# Patient Record
Sex: Female | Born: 1985 | Race: Black or African American | Hispanic: No | Marital: Single | Smoking: Never smoker
Health system: Southern US, Community
[De-identification: ages and names within clinical notes are randomized; demographics above are authoritative.]

## PROBLEM LIST (undated history)

## (undated) DIAGNOSIS — D649 Anemia, unspecified: Secondary | ICD-10-CM

---
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
## 2010-01-10 ENCOUNTER — Emergency Department (HOSPITAL_COMMUNITY): Admission: EM | Admit: 2010-01-10 | Discharge: 2010-01-10 | Payer: Self-pay | Admitting: Emergency Medicine

## 2010-03-20 ENCOUNTER — Ambulatory Visit: Payer: Self-pay | Admitting: Oncology

## 2010-04-10 LAB — CMP (CANCER CENTER ONLY)
Alkaline Phosphatase: 59 U/L (ref 26–84)
Calcium: 9.3 mg/dL (ref 8.0–10.3)
Chloride: 102 mEq/L (ref 98–108)
Sodium: 135 mEq/L (ref 128–145)

## 2010-04-10 LAB — CBC WITH DIFFERENTIAL (CANCER CENTER ONLY)
BASO#: 0 10*3/uL (ref 0.0–0.2)
Eosinophils Absolute: 0.1 10*3/uL (ref 0.0–0.5)
HCT: 36.4 % (ref 34.8–46.6)
LYMPH#: 1.5 10*3/uL (ref 0.9–3.3)
LYMPH%: 40.8 % (ref 14.0–48.0)
MCHC: 32.3 g/dL (ref 32.0–36.0)
MONO#: 0.2 10*3/uL (ref 0.1–0.9)
MONO%: 6.3 % (ref 0.0–13.0)
NEUT%: 51.1 % (ref 39.6–80.0)
Platelets: 153 10*3/uL (ref 145–400)
RDW: 16 % — ABNORMAL HIGH (ref 10.5–14.6)

## 2010-04-10 LAB — MORPHOLOGY - CHCC SATELLITE: PLT EST ~~LOC~~: ADEQUATE

## 2010-04-15 LAB — DIRECT ANTIGLOBULIN TEST (NOT AT ARMC): DAT (Complement): NEGATIVE

## 2010-04-15 LAB — IRON AND TIBC: %SAT: 11 % — ABNORMAL LOW (ref 20–55)

## 2010-04-15 LAB — HEMOGLOBINOPATHY EVALUATION
Hemoglobin Other: 0 % (ref 0.0–0.0)
Hgb A2 Quant: 5.5 % — ABNORMAL HIGH (ref 2.2–3.2)
Hgb A: 92.1 % — ABNORMAL LOW (ref 96.8–97.8)

## 2010-04-15 LAB — GLUCOSE 6 PHOSPHATE DEHYDROGENASE: G-6-PD, Quant: 9 U/g{Hb} (ref 4.6–13.5)

## 2010-04-15 LAB — VITAMIN B12: Vitamin B-12: 488 pg/mL (ref 211–911)

## 2010-04-15 LAB — FERRITIN: Ferritin: 6 ng/mL — ABNORMAL LOW (ref 10–291)

## 2010-04-15 LAB — RETICULOCYTES (CHCC): Retic Ct Pct: 0.5 % (ref 0.4–3.1)

## 2010-04-29 ENCOUNTER — Ambulatory Visit: Payer: Self-pay | Admitting: Oncology

## 2010-05-09 LAB — CBC WITH DIFFERENTIAL (CANCER CENTER ONLY)
BASO#: 0 10*3/uL (ref 0.0–0.2)
Eosinophils Absolute: 0.1 10*3/uL (ref 0.0–0.5)
HGB: 9.6 g/dL — ABNORMAL LOW (ref 11.6–15.9)
LYMPH%: 48.6 % — ABNORMAL HIGH (ref 14.0–48.0)
MCH: 23.2 pg — ABNORMAL LOW (ref 26.0–34.0)
MCHC: 32.5 g/dL (ref 32.0–36.0)
NEUT#: 1.3 10*3/uL — ABNORMAL LOW (ref 1.5–6.5)
Platelets: 142 10*3/uL — ABNORMAL LOW (ref 145–400)
RBC: 4.16 10*6/uL (ref 3.70–5.32)
WBC: 3.2 10*3/uL — ABNORMAL LOW (ref 3.9–10.0)

## 2010-05-28 ENCOUNTER — Ambulatory Visit: Payer: Self-pay | Admitting: Oncology

## 2010-06-27 ENCOUNTER — Ambulatory Visit: Payer: Self-pay | Admitting: Oncology

## 2010-06-28 LAB — IRON AND TIBC
Iron: 116 ug/dL (ref 42–145)
UIBC: 165 ug/dL

## 2010-06-28 LAB — CBC WITH DIFFERENTIAL (CANCER CENTER ONLY)
BASO#: 0 10*3/uL (ref 0.0–0.2)
BASO%: 0.3 % (ref 0.0–2.0)
EOS%: 1.8 % (ref 0.0–7.0)
HCT: 34.1 % — ABNORMAL LOW (ref 34.8–46.6)
LYMPH%: 55.3 % — ABNORMAL HIGH (ref 14.0–48.0)
MCHC: 33.2 g/dL (ref 32.0–36.0)
Platelets: 148 10*3/uL (ref 145–400)
WBC: 3 10*3/uL — ABNORMAL LOW (ref 3.9–10.0)

## 2010-07-31 ENCOUNTER — Encounter: Admission: RE | Admit: 2010-07-31 | Discharge: 2010-07-31 | Payer: Self-pay | Admitting: Family Medicine

## 2010-09-27 ENCOUNTER — Ambulatory Visit: Payer: Self-pay | Admitting: Oncology

## 2010-10-02 LAB — CBC WITH DIFFERENTIAL/PLATELET
EOS%: 2.1 % (ref 0.0–7.0)
LYMPH%: 50 % — ABNORMAL HIGH (ref 14.0–49.7)
MCV: 78.3 fL — ABNORMAL LOW (ref 79.5–101.0)
NEUT#: 1.5 10*3/uL (ref 1.5–6.5)
NEUT%: 40.2 % (ref 38.4–76.8)
Platelets: 161 10*3/uL (ref 145–400)
RDW: 13.2 % (ref 11.2–14.5)
WBC: 3.6 10*3/uL — ABNORMAL LOW (ref 3.9–10.3)

## 2010-10-02 LAB — IRON AND TIBC: UIBC: 198 ug/dL

## 2010-10-02 LAB — FERRITIN: Ferritin: 112 ng/mL (ref 10–291)

## 2010-12-03 ENCOUNTER — Ambulatory Visit
Admission: RE | Admit: 2010-12-03 | Discharge: 2010-12-03 | Payer: Self-pay | Source: Home / Self Care | Attending: Gynecology | Admitting: Gynecology

## 2011-06-27 ENCOUNTER — Encounter: Payer: Self-pay | Attending: Physical Medicine & Rehabilitation | Admitting: Physical Medicine & Rehabilitation

## 2011-09-10 ENCOUNTER — Other Ambulatory Visit: Payer: Self-pay | Admitting: Oncology

## 2011-09-10 DIAGNOSIS — D509 Iron deficiency anemia, unspecified: Secondary | ICD-10-CM

## 2011-09-19 ENCOUNTER — Ambulatory Visit: Payer: Self-pay | Admitting: Oncology

## 2011-09-19 ENCOUNTER — Other Ambulatory Visit: Payer: Self-pay | Admitting: Lab

## 2013-05-19 ENCOUNTER — Emergency Department (HOSPITAL_COMMUNITY)
Admission: EM | Admit: 2013-05-19 | Discharge: 2013-05-19 | Disposition: A | Discharge status: Home / Self Care | Payer: Worker's Compensation | Attending: Emergency Medicine | Admitting: Emergency Medicine

## 2013-05-19 ENCOUNTER — Encounter (HOSPITAL_COMMUNITY): Payer: Self-pay

## 2013-05-19 DIAGNOSIS — Y929 Unspecified place or not applicable: Secondary | ICD-10-CM | POA: Insufficient documentation

## 2013-05-19 DIAGNOSIS — Y939 Activity, unspecified: Secondary | ICD-10-CM | POA: Insufficient documentation

## 2013-05-19 DIAGNOSIS — S0990XA Unspecified injury of head, initial encounter: Secondary | ICD-10-CM | POA: Insufficient documentation

## 2013-05-19 DIAGNOSIS — W2209XA Striking against other stationary object, initial encounter: Secondary | ICD-10-CM | POA: Insufficient documentation

## 2013-05-19 MED ORDER — IBUPROFEN 400 MG PO TABS: 400.0000 mg | ORAL_TABLET | Freq: Four times a day (QID) | ORAL | Status: DC | PRN

## 2013-05-19 NOTE — ED Provider Notes (Signed)
   History    This chart was scribed for Kaylee Simmons, non-physician practitioner working with Juliet Rude. Rubin Payor, MD by Leone Payor, ED Scribe. This patient was seen in room TR08C/TR08C and the patient's care was started at 1805.  CSN: [id number] Arrival date & time 05/19/13  1805  First MD Initiated Contact with Patient 05/19/13 1812     Chief Complaint  Patient presents with  . Headache    The history is provided by the patient. No language interpreter was used.    HPI Comments: Kaylee Simmons is a 27 y.o. female who presents to the Emergency Department complaining of constant, unchanged scalp tenderness that started yesterday after hitting her head on the cabinet door. Pt took some tylenol with some moderate relief but she still has pain to the site of impact. She denies taking blood thinning medication.  She denies LOC, neck pain, slurred speech.    History reviewed. No pertinent past medical history. History reviewed. No pertinent past surgical history. History reviewed. No pertinent family history. History  Substance Use Topics  . Smoking status: Never Smoker   . Smokeless tobacco: Not on file  . Alcohol Use: No   OB History   Grav Para Term Preterm Abortions TAB SAB Ect Mult Living                 Review of Systems  Neurological: Positive for headaches. Negative for syncope and facial asymmetry.  All other systems reviewed and are negative.    Allergies  Review of patient's allergies indicates no known allergies.  Home Medications  No current outpatient prescriptions on file. BP 127/79  Pulse 97  Temp(Src) 98.4 F (36.9 C) (Oral)  Resp 16  SpO2 100%  LMP 04/25/2013 Physical Exam  Nursing note and vitals reviewed. Constitutional: She is oriented to person, place, and time. She appears well-developed and well-nourished.  HENT:  Head: Normocephalic and atraumatic.  Mild redness to vertex to scalp. Tenderness to palpation. No swelling or laceration. No abnormal  bruising.   Eyes: Conjunctivae and EOM are normal. Pupils are equal, round, and reactive to light.  Neck: Normal range of motion. Neck supple.  Cardiovascular: Normal rate, regular rhythm and normal heart sounds.   Pulmonary/Chest: Effort normal and breath sounds normal.  Abdominal: Soft. Bowel sounds are normal.  Musculoskeletal: Normal range of motion.  Neurological: She is alert and oriented to person, place, and time.  Skin: Skin is warm and dry.  Psychiatric: She has a normal mood and affect.    ED Course  Procedures (including critical care time)  DIAGNOSTIC STUDIES: Oxygen Saturation is 100% on RA, normal by my interpretation.    COORDINATION OF CARE: 6:19 PM Discussed treatment plan with pt at bedside and pt agreed to plan.   Labs Reviewed - No data to display No results found. 1. Scalp tenderness   2. Head injury without skull fracture, initial encounter     MDM  BP 127/79  Pulse 97  Temp(Src) 98.4 F (36.9 C) (Oral)  Resp 16  SpO2 100%  LMP 04/25/2013   I personally performed the services described in this documentation, which was scribed in my presence. The recorded information has been reviewed and is accurate.    Kaylee Helper, PA-C 05/19/13 1828

## 2013-05-19 NOTE — ED Notes (Signed)
Pt c/o headache x1 day after she bent over yesterday to pick something up and hit her head on the cabinet door.

## 2013-05-21 NOTE — ED Provider Notes (Signed)
Medical screening examination/treatment/procedure(s) were performed by non-physician practitioner and as supervising physician I was immediately available for consultation/collaboration.  Betzayda Braxton R. Alvera Tourigny, MD 05/21/13 1358 

## 2014-07-01 ENCOUNTER — Encounter (HOSPITAL_COMMUNITY): Payer: Self-pay | Admitting: Emergency Medicine

## 2014-07-01 ENCOUNTER — Emergency Department (HOSPITAL_COMMUNITY)
Admission: EM | Admit: 2014-07-01 | Discharge: 2014-07-01 | Disposition: A | Discharge status: Home / Self Care | Payer: No Typology Code available for payment source | Source: Home / Self Care | Attending: Family Medicine | Admitting: Family Medicine

## 2014-07-01 DIAGNOSIS — S239XXA Sprain of unspecified parts of thorax, initial encounter: Secondary | ICD-10-CM

## 2014-07-01 MED ORDER — METAXALONE 800 MG PO TABS: 800.0000 mg | ORAL_TABLET | Freq: Three times a day (TID) | ORAL | Status: DC

## 2014-07-01 NOTE — ED Provider Notes (Signed)
CSN: [id number]     Arrival date & time 07/01/14  0907 History   [id number] MD Initiated Contact with Patient 07/01/14 [phone]     Chief Complaint  Patient presents with  . Back Pain   (Consider location/radiation/quality/duration/timing/severity/associated sxs/prior Treatment) Patient is a 28 y.o. female presenting with back pain. The history is provided by the patient.  Back Pain Location:  Thoracic spine Quality:  Shooting and stiffness Stiffness is present:  In the morning Radiates to:  Does not radiate Pain severity:  Mild Onset quality:  Sudden Duration:  1 day Chronicity:  New Context comment:  Getting out of bed. Relieved by:  None tried Worsened by:  Nothing tried   History reviewed. No pertinent past medical history. History reviewed. No pertinent past surgical history. No family history on file. History  Substance Use Topics  . Smoking status: Never Smoker   . Smokeless tobacco: Not on file  . Alcohol Use: No   OB History   Grav Para Term Preterm Abortions TAB SAB Ect Mult Living                 Review of Systems  Constitutional: Negative.   Cardiovascular: Negative.   Musculoskeletal: Positive for back pain. Negative for gait problem and joint swelling.  Skin: Negative.     Allergies  Review of patient's allergies indicates no known allergies.  Home Medications   Prior to Admission medications   Medication Sig Start Date End Date Taking? Authorizing Provider  acetaminophen (TYLENOL) 325 MG tablet Take 650 mg by mouth every 6 (six) hours as needed for pain.    Historical Provider, MD  doxycycline (DORYX) 100 MG DR capsule Take 100 mg by mouth 2 (two) times daily.    Historical Provider, MD  ibuprofen (ADVIL,MOTRIN) 400 MG tablet Take 1 tablet (400 mg total) by mouth every 6 (six) hours as needed for pain. 05/19/13   Fayrene HelperBowie Tran, PA-C  metaxalone (SKELAXIN) 800 MG tablet Take 1 tablet (800 mg total) by mouth 3 (three) times daily. Prn muscle relaxer 07/01/14    Linna HoffJames D Wiliam Cauthorn, MD   BP 116/73  Pulse 74  Temp(Src) 98.7 F (37.1 C) (Oral)  Resp 16  SpO2 100%  LMP 06/10/2014 Physical Exam  Nursing note and vitals reviewed. Constitutional: She is oriented to person, place, and time. She appears well-developed and well-nourished.  Neck: Normal range of motion. Neck supple.  Pulmonary/Chest: Effort normal and breath sounds normal.  Musculoskeletal:       Back:  Neurological: She is alert and oriented to person, place, and time.  Skin: Skin is warm.    ED Course  Procedures (including critical care time) Labs Review Labs Reviewed - No data to display  Imaging Review No results found.   MDM   1. Thoracic back sprain, initial encounter        Linna HoffJames D Eliyana Pagliaro, MD 07/01/14 [phone]

## 2014-07-01 NOTE — ED Notes (Signed)
Mid-back pain, no known injury.  Area hurts with coughing, sneezing, pain increased throughout the day

## 2017-02-20 ENCOUNTER — Other Ambulatory Visit (HOSPITAL_COMMUNITY)
Admission: RE | Admit: 2017-02-20 | Discharge: 2017-02-20 | Disposition: A | Discharge status: Home / Self Care | Payer: BLUE CROSS/BLUE SHIELD | Source: Ambulatory Visit | Attending: Obstetrics & Gynecology | Admitting: Obstetrics & Gynecology

## 2017-02-20 ENCOUNTER — Other Ambulatory Visit: Payer: Self-pay | Admitting: Obstetrics & Gynecology

## 2017-02-20 DIAGNOSIS — Z01419 Encounter for gynecological examination (general) (routine) without abnormal findings: Secondary | ICD-10-CM | POA: Insufficient documentation

## 2017-02-20 DIAGNOSIS — Z1151 Encounter for screening for human papillomavirus (HPV): Secondary | ICD-10-CM | POA: Diagnosis present

## 2017-02-24 LAB — CYTOLOGY - PAP
DIAGNOSIS: NEGATIVE
HPV (WINDOPATH): NOT DETECTED

## 2017-07-02 ENCOUNTER — Encounter (HOSPITAL_BASED_OUTPATIENT_CLINIC_OR_DEPARTMENT_OTHER): Payer: Self-pay | Admitting: *Deleted

## 2017-07-02 ENCOUNTER — Emergency Department (HOSPITAL_BASED_OUTPATIENT_CLINIC_OR_DEPARTMENT_OTHER)
Admission: EM | Admit: 2017-07-02 | Discharge: 2017-07-02 | Disposition: A | Discharge status: Home / Self Care | Payer: No Typology Code available for payment source | Attending: Emergency Medicine | Admitting: Emergency Medicine

## 2017-07-02 ENCOUNTER — Emergency Department (HOSPITAL_BASED_OUTPATIENT_CLINIC_OR_DEPARTMENT_OTHER): Payer: No Typology Code available for payment source

## 2017-07-02 DIAGNOSIS — Y939 Activity, unspecified: Secondary | ICD-10-CM | POA: Diagnosis not present

## 2017-07-02 DIAGNOSIS — Y999 Unspecified external cause status: Secondary | ICD-10-CM | POA: Insufficient documentation

## 2017-07-02 DIAGNOSIS — Y929 Unspecified place or not applicable: Secondary | ICD-10-CM | POA: Diagnosis not present

## 2017-07-02 DIAGNOSIS — M546 Pain in thoracic spine: Secondary | ICD-10-CM | POA: Insufficient documentation

## 2017-07-02 DIAGNOSIS — M549 Dorsalgia, unspecified: Secondary | ICD-10-CM | POA: Diagnosis present

## 2017-07-02 DIAGNOSIS — Z79899 Other long term (current) drug therapy: Secondary | ICD-10-CM | POA: Diagnosis not present

## 2017-07-02 HISTORY — DX: Anemia, unspecified: D64.9

## 2017-07-02 MED ORDER — NAPROXEN 250 MG PO TABS: 375.0000 mg | ORAL_TABLET | Freq: Once | ORAL | Status: DC

## 2017-07-02 MED ORDER — KETOROLAC TROMETHAMINE 30 MG/ML IJ SOLN
30.0000 mg | Freq: Once | INTRAMUSCULAR | Status: AC
  Administered 2017-07-02: 30 mg via INTRAMUSCULAR
  Filled 2017-07-02: qty 1

## 2017-07-02 MED ORDER — NAPROXEN 375 MG PO TABS: 375.0000 mg | ORAL_TABLET | Freq: Two times a day (BID) | ORAL | 0 refills | Status: DC

## 2017-07-02 NOTE — ED Notes (Signed)
Pt was in bathroom and has returned to room.

## 2017-07-02 NOTE — ED Notes (Signed)
Patient left without her discharge instruction and prescription for Naproxene.  Discharge instruction and prescription given to the registration office.  MD given patient verbal instruction and she verbalized understanding when I asked her.
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
## 2018-01-16 ENCOUNTER — Encounter (HOSPITAL_BASED_OUTPATIENT_CLINIC_OR_DEPARTMENT_OTHER): Payer: Self-pay | Admitting: Adult Health

## 2018-01-16 ENCOUNTER — Other Ambulatory Visit: Payer: Self-pay

## 2018-01-16 ENCOUNTER — Emergency Department (HOSPITAL_BASED_OUTPATIENT_CLINIC_OR_DEPARTMENT_OTHER)
Admission: EM | Admit: 2018-01-16 | Discharge: 2018-01-17 | Disposition: A | Discharge status: Home / Self Care | Payer: BLUE CROSS/BLUE SHIELD | Attending: Emergency Medicine | Admitting: Emergency Medicine

## 2018-01-16 DIAGNOSIS — R102 Pelvic and perineal pain: Secondary | ICD-10-CM | POA: Insufficient documentation

## 2018-01-16 DIAGNOSIS — N939 Abnormal uterine and vaginal bleeding, unspecified: Secondary | ICD-10-CM | POA: Diagnosis present

## 2018-01-16 DIAGNOSIS — N938 Other specified abnormal uterine and vaginal bleeding: Secondary | ICD-10-CM | POA: Diagnosis not present

## 2018-01-16 DIAGNOSIS — Z79899 Other long term (current) drug therapy: Secondary | ICD-10-CM | POA: Insufficient documentation

## 2018-01-16 LAB — URINALYSIS, ROUTINE W REFLEX MICROSCOPIC
Bilirubin Urine: NEGATIVE
GLUCOSE, UA: NEGATIVE mg/dL
KETONES UR: 15 mg/dL — AB
LEUKOCYTES UA: NEGATIVE
Nitrite: NEGATIVE
PROTEIN: NEGATIVE mg/dL
Specific Gravity, Urine: >1.03 — ABNORMAL HIGH (ref 1.005–1.030)
pH: 5.5 (ref 5.0–8.0)

## 2018-01-16 LAB — PREGNANCY, URINE: PREG TEST UR: NEGATIVE

## 2018-01-16 LAB — WET PREP, GENITAL
Clue Cells Wet Prep HPF POC: NONE SEEN
Sperm: NONE SEEN
TRICH WET PREP: NONE SEEN
Yeast Wet Prep HPF POC: NONE SEEN

## 2018-01-16 LAB — URINALYSIS, MICROSCOPIC (REFLEX)

## 2018-01-16 NOTE — ED Provider Notes (Signed)
[facility] Provider Note: Lowella DellJ. Lane Devere Brem, MD, FACEP  CSN: [id number] MRN: [id number] ARRIVAL: 01/16/18 at 2131 ROOM: MH10/MH10   CHIEF COMPLAINT  Vaginal Bleeding   HISTORY OF PRESENT ILLNESS  01/16/18 11:00 PM Kaylee Simmons is a 32 y.o. female who has been bleeding vaginally for the past 11 days.  This is earlier than her expected time of menses.  She was on birth control pills but stopped taking them when this bleeding began.  Bleeding has been heavier than a period at times but has lightened in the past 2 days.  She has had clots at times.  She has had pelvic pain along with the bleeding which she describes as somewhat like cramps and somewhat like a pulling sensation.  Pain is worse with movement or palpation.  It is more prominent on the right than the left.  She saw her OB/GYN yesterday and was scheduled for an ultrasound on the 18th of this month but is here because she does not want to wait for the ultrasound and would like to know what is going on tonight.   Past Medical History:  Diagnosis Date  . Anemia     History reviewed. No pertinent surgical history.  History reviewed. No pertinent family history.  Social History   Tobacco Use  . Smoking status: Never Smoker  . Smokeless tobacco: Never Used  Substance Use Topics  . Alcohol use: No  . Drug use: No    Prior to Admission medications   Medication Sig Start Date End Date Taking? Authorizing Provider  Ascorbic Acid (VITAMIN C) 100 MG tablet Take 100 mg by mouth daily.   Yes [provider]  IRON PO Take by mouth.   Yes [provider]  norgestimate-ethinyl estradiol (ORTHO-CYCLEN,SPRINTEC,PREVIFEM) 0.25-35 MG-MCG tablet Take 1 tablet by mouth daily.   Yes [provider]  acetaminophen (TYLENOL) 325 MG tablet Take 650 mg by mouth every 6 (six) hours as needed for pain.    [provider]  doxycycline (DORYX) 100 MG DR capsule Take 100 mg by mouth 2 (two) times daily.     [provider]  ibuprofen (ADVIL,MOTRIN) 400 MG tablet Take 1 tablet (400 mg total) by mouth every 6 (six) hours as needed for pain. 05/19/13   Fayrene Helperran, Bowie, PA-C  metaxalone (SKELAXIN) 800 MG tablet Take 1 tablet (800 mg total) by mouth 3 (three) times daily. Prn muscle relaxer 07/01/14   Linna HoffKindl, James D, MD  naproxen (NAPROSYN) 375 MG tablet Take 1 tablet (375 mg total) by mouth 2 (two) times daily. 07/02/17   Felicie MornSmith, David, NP    Allergies Penicillins   REVIEW OF SYSTEMS  Negative except as noted here or in the History of Present Illness.   PHYSICAL EXAMINATION  Initial Vital Signs Blood pressure 124/90, pulse (!) 106, temperature 99.5 F (37.5 C), temperature source Oral, resp. rate 20, last menstrual period 01/06/2018, SpO2 99 %.  Examination General: Well-developed, well-nourished female in no acute distress; appearance consistent with age of record HENT: normocephalic; atraumatic Eyes: pupils equal, round and reactive to light; extraocular muscles intact Neck: supple Heart: regular rate and rhythm Lungs: clear to auscultation bilaterally Abdomen: soft; nondistended; suprapubic tenderness; no masses or hepatosplenomegaly; bowel sounds present GU: Normal external genitalia; vaginal bleeding; cervical motion tenderness; adnexal tenderness, right greater than left Extremities: No deformity; full range of motion; pulses normal Neurologic: Awake, alert and oriented; motor function intact in all extremities and symmetric; no facial droop Skin: Warm and dry  Psychiatric: Normal mood and affect   RESULTS  Summary of this visit's results, reviewed by myself:   EKG Interpretation  Date/Time:    Ventricular Rate:    PR Interval:    QRS Duration:   QT Interval:    QTC Calculation:   R Axis:     Text Interpretation:        Laboratory Studies: Results for orders placed or performed during the hospital encounter of 01/16/18 (from the past 24 hour(s))  Urinalysis,  Routine w reflex microscopic     Status: Abnormal   Collection Time: 01/16/18 10:50 PM  Result Value Ref Range   Color, Urine YELLOW YELLOW   APPearance CLEAR CLEAR   Specific Gravity, Urine >1.030 (H) 1.005 - 1.030   pH 5.5 5.0 - 8.0   Glucose, UA NEGATIVE NEGATIVE mg/dL   Hgb urine dipstick LARGE (A) NEGATIVE   Bilirubin Urine NEGATIVE NEGATIVE   Ketones, ur 15 (A) NEGATIVE mg/dL   Protein, ur NEGATIVE NEGATIVE mg/dL   Nitrite NEGATIVE NEGATIVE   Leukocytes, UA NEGATIVE NEGATIVE  Pregnancy, urine     Status: None   Collection Time: 01/16/18 10:50 PM  Result Value Ref Range   Preg Test, Ur NEGATIVE NEGATIVE  Urinalysis, Microscopic (reflex)     Status: Abnormal   Collection Time: 01/16/18 10:50 PM  Result Value Ref Range   RBC / HPF 6-30 0 - 5 RBC/hpf   WBC, UA 0-5 0 - 5 WBC/hpf   Bacteria, UA FEW (A) NONE SEEN   Squamous Epithelial / LPF 0-5 (A) NONE SEEN   Mucus PRESENT   Wet prep, genital     Status: Abnormal   Collection Time: 01/16/18 11:15 PM  Result Value Ref Range   Yeast Wet Prep HPF POC NONE SEEN NONE SEEN   Trich, Wet Prep NONE SEEN NONE SEEN   Clue Cells Wet Prep HPF POC NONE SEEN NONE SEEN   WBC, Wet Prep HPF POC FEW (A) NONE SEEN   Sperm NONE SEEN    Imaging Studies: No results found.  ED COURSE  Nursing notes and initial vitals signs, including pulse oximetry, reviewed.  Vitals:   01/16/18 2137 01/17/18 0000  BP: 124/90 113/75  Pulse: (!) 106 89  Resp: 20 16  Temp: 99.5 F (37.5 C)   TempSrc: Oral   SpO2: 99% 100%   12:29 AM We will go ahead and treat for GC and chlamydia as early PID or cervicitis can present with the symptoms.  We will have her return later today for a pelvic ultrasound.  PROCEDURES    ED DIAGNOSES     ICD-10-CM   1. Dysfunctional uterine bleeding N93.8   2. Pelvic pain in female R10.2        Paula Libra, MD 01/17/18 0030

## 2018-01-16 NOTE — ED Notes (Signed)
ED Provider at bedside. 

## 2018-01-16 NOTE — ED Triage Notes (Addendum)
PResents with menses for 11 days, she reports that she is changing her pads every 4-5 hours and this is abnormal for her assoiaited with right sided pain, she says it feels like her right ovary is inflamed. Endorses clots.  She says she started Birth control a few months ago and this period is dfferent because of pain. She says she has an US scheduled next Monday the 18th but can't wait that long and wants to know what is wrong.

## 2018-01-17 ENCOUNTER — Emergency Department (HOSPITAL_BASED_OUTPATIENT_CLINIC_OR_DEPARTMENT_OTHER)
Admit: 2018-01-17 | Discharge: 2018-01-17 | Disposition: A | Discharge status: Home / Self Care | Payer: BLUE CROSS/BLUE SHIELD | Attending: Emergency Medicine | Admitting: Emergency Medicine

## 2018-01-17 ENCOUNTER — Ambulatory Visit (HOSPITAL_BASED_OUTPATIENT_CLINIC_OR_DEPARTMENT_OTHER)
Admit: 2018-01-17 | Discharge: 2018-01-17 | Disposition: A | Discharge status: Home / Self Care | Payer: BLUE CROSS/BLUE SHIELD | Attending: Emergency Medicine | Admitting: Emergency Medicine

## 2018-01-17 MED ORDER — AZITHROMYCIN 250 MG PO TABS
1000.0000 mg | ORAL_TABLET | Freq: Once | ORAL | Status: AC
Start: 2018-01-17 — End: 2018-01-17
  Administered 2018-01-17: 1000 mg via ORAL
  Filled 2018-01-17: qty 4

## 2018-01-17 MED ORDER — CEFTRIAXONE SODIUM 250 MG IJ SOLR
250.0000 mg | Freq: Once | INTRAMUSCULAR | Status: AC
Start: 2018-01-17 — End: 2018-01-17
  Administered 2018-01-17: 250 mg via INTRAMUSCULAR
  Filled 2018-01-17: qty 250

## 2018-01-18 LAB — GC/CHLAMYDIA PROBE AMP (~~LOC~~) NOT AT ARMC
Chlamydia: NEGATIVE
Neisseria Gonorrhea: NEGATIVE

## 2018-01-21 ENCOUNTER — Encounter (HOSPITAL_COMMUNITY): Payer: Self-pay | Admitting: *Deleted

## 2018-01-21 ENCOUNTER — Other Ambulatory Visit: Payer: Self-pay

## 2018-01-26 NOTE — H&P (Signed)
32yo G0 who presents for hysteroscopy, D&C, Myosure polypectomy due to abnormal uterine bleeding.  Pt has been on OCPs; however, her periods do not occur at the appropriate "scheduled times.  Pt reports that her menses last for over 8 days.  Bleeding is moderate with 3-4 heavy days where she may use a super tampon every 1-2 hours.  She has tried different OCPs, Nuva ring and Mirena.   An [place] was completed 03/2017 that showed: 7.6cm anteverted uteus with ?hyperechoic 1.3 post mass. No other associated symptoms. Of note, bleeding has led to iron-def. anemia- currently on iron (lab work at outside facility) Denies headache, dizziness, some fatigue.      ROS:  CONSTITUTIONAL:  no Fatigue. no Fever. no Weight gain. no Weight loss.  HEENT:  no nasal congestion. no Visual changes.  CARDIOLOGY:  no Chest pain. no Dyspnea on exertion. no Edema. no Palpitations.  RESPIRATORY:  no Shortness of breath. no Cough. no Hemoptysis.  UROLOGY:  no Pain with urination. no Urinary urgency. no Urinary frequency. no Urinary incontinence.  GASTROENTEROLOGY:  Abdominal pain yes. Appetite change yes. no Blood in stool or on toilet paper. no Constipation. Diarrhea yes.  FEMALE REPRODUCTIVE:  no Abnormal vaginal bleeding. no Abnormal vaginal discharge. no Breast pain. no Breast tenderness. no Dyspareunia. no Hot flashes. no Vaginal irritation. no Vaginal itching.  NEUROLOGY:  no Dizziness. no Fainting. Headache yes.  PSYCHOLOGY:  no Anxiety. no Depression.  SKIN:  no Rash. no Hives.  ENDOCRINOLOGY:  no Cold intolerance. no Excessive thirst. no Excessive urination.         Medical History: Anemia.         Gyn History:  Sexual activity currently sexually active.  Periods : irregular.  LMP 01/06/18.  Birth control ocp.  Last pap smear date 02/20/17.  Denies H/O Last mammogram date.  Denies H/O Abnormal pap smear.  Denies H/O STD.        OB History:  Never been pregnant per patient.        Family  History: Father: alive, A + W. Mother: alive, A + W. Paternal Grand Mother: diagnosed with Diabetes.  denies any GYN family cancer hx.       Social History:  General:  Tobacco use  cigarettes: Never smoked Tobacco history last updated 01/15/2018 no Alcohol.  no Recreational drug use.  Exercise: intermittent.  Marital Status: single.  Children: none.  OCCUPATION: employed, [employer].        Medications:  Taking Vitamin C 500 MG Capsule Orally Taking Ferrous Sulfate 325 (65 Fe) MG Tablet 1 tablet Orally Once a day Taking Sprintec 28(Norgestimate-Eth Estradiol) 0.25-35 MG-MCG Tablet 1 tablet Orally Once a day       Allergies: Penicillin (for allergy): itching.    O:  Examination performed in office: Vitals: Wt 143, Wt change -3 lb, Ht 66, BMI 23.08, Pulse sitting 82, BP sitting 102/62.      Examination:  General Examination:  GENERAL APPEARANCE well developed, well nourished .  SKIN: warm and dry, no rashes .  NECK: supple, normal appearance .  LUNGS: clear to auscultation bilaterally, no wheezes, rhonchi, rales.  HEART: no murmurs, regular rate and rhythm.  ABDOMEN: soft and not tender, no masses palpated, no rebound, no rigidity, no reproducible pain.  FEMALE GENITOURINARY: normal external genitalia, labia - unremarkable, vagina - pink moist mucosa, no lesions, blood noted in vault ~ 10cc, cervix - no discharge or lesions or CMT- no active bleeding from os, adnexa -  no masses or tenderness, uterus - nontender and normal size on palpation.  MUSCULOSKELETAL no calf tenderness bilaterally .  EXTREMITIES: no edema present .  NEUROLOGIC EXAM: alert and oriented x 3.  PSYCH: appropriate mood and affect .     A/P: 32yo G0 who presents for hysteroscopy, D&C, myosure polypectomy due to AUB -NPO -LR @ 125cc/hr -SCDs to OR -Risk/benefit reviewed including but not limited to risk of bleeding, infection and potential for uterine perforation.  Pt aware and wishes to  proceed  Myna HidalgoJennifer Lauriana Denes, DO [phone] (cell) [phone] (office)

## 2018-02-01 ENCOUNTER — Ambulatory Visit (HOSPITAL_COMMUNITY): Payer: BLUE CROSS/BLUE SHIELD | Admitting: Anesthesiology

## 2018-02-01 ENCOUNTER — Encounter (HOSPITAL_COMMUNITY)
Admission: RE | Disposition: A | Discharge status: Home / Self Care | Payer: Self-pay | Source: Ambulatory Visit | Attending: Obstetrics & Gynecology

## 2018-02-01 ENCOUNTER — Encounter (HOSPITAL_COMMUNITY): Payer: Self-pay | Admitting: Anesthesiology

## 2018-02-01 ENCOUNTER — Other Ambulatory Visit: Payer: Self-pay

## 2018-02-01 ENCOUNTER — Ambulatory Visit (HOSPITAL_COMMUNITY)
Admission: RE | Admit: 2018-02-01 | Discharge: 2018-02-01 | Disposition: A | Discharge status: Home / Self Care | Payer: BLUE CROSS/BLUE SHIELD | Source: Ambulatory Visit | Attending: Obstetrics & Gynecology | Admitting: Obstetrics & Gynecology

## 2018-02-01 DIAGNOSIS — D649 Anemia, unspecified: Secondary | ICD-10-CM | POA: Diagnosis not present

## 2018-02-01 DIAGNOSIS — N939 Abnormal uterine and vaginal bleeding, unspecified: Secondary | ICD-10-CM | POA: Insufficient documentation

## 2018-02-01 DIAGNOSIS — N854 Malposition of uterus: Secondary | ICD-10-CM | POA: Diagnosis not present

## 2018-02-01 DIAGNOSIS — Z880 Allergy status to penicillin: Secondary | ICD-10-CM | POA: Diagnosis not present

## 2018-02-01 HISTORY — PX: DILATATION & CURETTAGE/HYSTEROSCOPY WITH MYOSURE: SHX6511

## 2018-02-01 LAB — CBC
HCT: 35.7 % — ABNORMAL LOW (ref 36.0–46.0)
HEMOGLOBIN: 11.7 g/dL — AB (ref 12.0–15.0)
MCH: 23.8 pg — ABNORMAL LOW (ref 26.0–34.0)
MCHC: 32.8 g/dL (ref 30.0–36.0)
MCV: 72.6 fL — ABNORMAL LOW (ref 78.0–100.0)
Platelets: 154 10*3/uL (ref 150–400)
RBC: 4.92 MIL/uL (ref 3.87–5.11)
RDW: 15.1 % (ref 11.5–15.5)
WBC: 3.4 10*3/uL — AB (ref 4.0–10.5)

## 2018-02-01 LAB — BASIC METABOLIC PANEL
Anion gap: 9 (ref 5–15)
BUN: 7 mg/dL (ref 6–20)
CALCIUM: 9 mg/dL (ref 8.9–10.3)
CHLORIDE: 107 mmol/L (ref 101–111)
CO2: 21 mmol/L — ABNORMAL LOW (ref 22–32)
CREATININE: 0.83 mg/dL (ref 0.44–1.00)
GFR calc Af Amer: >60 mL/min (ref >60)
GFR calc non Af Amer: >60 mL/min (ref >60)
Glucose, Bld: 93 mg/dL (ref 65–99)
Potassium: 3.9 mmol/L (ref 3.5–5.1)
Sodium: 137 mmol/L (ref 135–145)

## 2018-02-01 LAB — PREGNANCY, URINE: PREG TEST UR: NEGATIVE

## 2018-02-01 SURGERY — DILATATION & CURETTAGE/HYSTEROSCOPY WITH MYOSURE
Anesthesia: General | Site: Vagina

## 2018-02-01 MED ORDER — MEPERIDINE HCL 25 MG/ML IJ SOLN: 6.2500 mg | INTRAMUSCULAR | Status: DC | PRN

## 2018-02-01 MED ORDER — LACTATED RINGERS IV SOLN: INTRAVENOUS | Status: DC

## 2018-02-01 MED ORDER — METOCLOPRAMIDE HCL 5 MG/ML IJ SOLN: 10.0000 mg | Freq: Once | INTRAMUSCULAR | Status: DC | PRN

## 2018-02-01 MED ORDER — KETOROLAC TROMETHAMINE 30 MG/ML IJ SOLN
INTRAMUSCULAR | Status: DC | PRN
  Administered 2018-02-01: 30 mg via INTRAVENOUS

## 2018-02-01 MED ORDER — FENTANYL CITRATE (PF) 100 MCG/2ML IJ SOLN
25.0000 ug | INTRAMUSCULAR | Status: DC | PRN
  Administered 2018-02-01: 50 ug via INTRAVENOUS

## 2018-02-01 MED ORDER — PROPOFOL 10 MG/ML IV BOLUS
INTRAVENOUS | Status: DC | PRN
  Administered 2018-02-01: 180 mg via INTRAVENOUS

## 2018-02-01 MED ORDER — LACTATED RINGERS IV SOLN
INTRAVENOUS | Status: DC
  Administered 2018-02-01 (×2): via INTRAVENOUS

## 2018-02-01 MED ORDER — MIDAZOLAM HCL 2 MG/2ML IJ SOLN
INTRAMUSCULAR | Status: AC
  Filled 2018-02-01: qty 2

## 2018-02-01 MED ORDER — FENTANYL CITRATE (PF) 100 MCG/2ML IJ SOLN
INTRAMUSCULAR | Status: AC
  Filled 2018-02-01: qty 2

## 2018-02-01 MED ORDER — SCOPOLAMINE 1 MG/3DAYS TD PT72
1.0000 | MEDICATED_PATCH | Freq: Once | TRANSDERMAL | Status: DC
  Administered 2018-02-01: 1.5 mg via TRANSDERMAL

## 2018-02-01 MED ORDER — SCOPOLAMINE 1 MG/3DAYS TD PT72
MEDICATED_PATCH | TRANSDERMAL | Status: AC
  Administered 2018-02-01: 1.5 mg via TRANSDERMAL
  Filled 2018-02-01: qty 1

## 2018-02-01 MED ORDER — FENTANYL CITRATE (PF) 250 MCG/5ML IJ SOLN
INTRAMUSCULAR | Status: AC
  Filled 2018-02-01: qty 5

## 2018-02-01 MED ORDER — ONDANSETRON HCL 4 MG/2ML IJ SOLN
INTRAMUSCULAR | Status: AC
  Filled 2018-02-01: qty 2

## 2018-02-01 MED ORDER — MIDAZOLAM HCL 2 MG/2ML IJ SOLN
INTRAMUSCULAR | Status: DC | PRN
  Administered 2018-02-01: 2 mg via INTRAVENOUS

## 2018-02-01 MED ORDER — PROPOFOL 10 MG/ML IV BOLUS
INTRAVENOUS | Status: AC
  Filled 2018-02-01: qty 20

## 2018-02-01 MED ORDER — SODIUM CHLORIDE 0.9 % IR SOLN
Status: DC | PRN
  Administered 2018-02-01: 1000 mL

## 2018-02-01 MED ORDER — FENTANYL CITRATE (PF) 100 MCG/2ML IJ SOLN
INTRAMUSCULAR | Status: DC | PRN
  Administered 2018-02-01 (×2): 50 ug via INTRAVENOUS

## 2018-02-01 MED ORDER — DEXAMETHASONE SODIUM PHOSPHATE 10 MG/ML IJ SOLN
INTRAMUSCULAR | Status: DC | PRN
  Administered 2018-02-01: 10 mg via INTRAVENOUS

## 2018-02-01 MED ORDER — KETOROLAC TROMETHAMINE 30 MG/ML IJ SOLN
INTRAMUSCULAR | Status: AC
  Filled 2018-02-01: qty 1

## 2018-02-01 MED ORDER — LIDOCAINE-EPINEPHRINE 1 %-1:100000 IJ SOLN
INTRAMUSCULAR | Status: AC
  Filled 2018-02-01: qty 1

## 2018-02-01 MED ORDER — HYDROCODONE-ACETAMINOPHEN 7.5-325 MG PO TABS
ORAL_TABLET | ORAL | Status: AC
  Filled 2018-02-01: qty 1

## 2018-02-01 MED ORDER — LIDOCAINE-EPINEPHRINE 1 %-1:100000 IJ SOLN
INTRAMUSCULAR | Status: DC | PRN
  Administered 2018-02-01: 20 mL

## 2018-02-01 MED ORDER — LIDOCAINE HCL (CARDIAC) 20 MG/ML IV SOLN
INTRAVENOUS | Status: DC | PRN
  Administered 2018-02-01: 80 mg via INTRAVENOUS

## 2018-02-01 MED ORDER — ONDANSETRON HCL 4 MG/2ML IJ SOLN
INTRAMUSCULAR | Status: DC | PRN
  Administered 2018-02-01: 4 mg via INTRAVENOUS

## 2018-02-01 MED ORDER — LIDOCAINE HCL (CARDIAC) 20 MG/ML IV SOLN
INTRAVENOUS | Status: AC
  Filled 2018-02-01: qty 5

## 2018-02-01 MED ORDER — DEXAMETHASONE SODIUM PHOSPHATE 4 MG/ML IJ SOLN
INTRAMUSCULAR | Status: AC
  Filled 2018-02-01: qty 1

## 2018-02-01 MED ORDER — HYDROCODONE-ACETAMINOPHEN 7.5-325 MG PO TABS
1.0000 | ORAL_TABLET | Freq: Once | ORAL | Status: AC | PRN
  Administered 2018-02-01: 1 via ORAL

## 2018-02-01 SURGICAL SUPPLY — 17 items
CANISTER SUCT 3000ML PPV (MISCELLANEOUS) ×3 IMPLANT
CATH ROBINSON RED A/P 16FR (CATHETERS) ×3 IMPLANT
DEVICE MYOSURE LITE (MISCELLANEOUS) IMPLANT
DEVICE MYOSURE REACH (MISCELLANEOUS) IMPLANT
DILATOR CANAL MILEX (MISCELLANEOUS) IMPLANT
GLOVE BIOGEL PI IND STRL 6.5 (GLOVE) ×1 IMPLANT
GLOVE BIOGEL PI IND STRL 7.0 (GLOVE) ×1 IMPLANT
GLOVE BIOGEL PI INDICATOR 6.5 (GLOVE) ×2
GLOVE BIOGEL PI INDICATOR 7.0 (GLOVE) ×2
GLOVE ECLIPSE 6.5 STRL STRAW (GLOVE) ×3 IMPLANT
GOWN STRL REUS W/TWL LRG LVL3 (GOWN DISPOSABLE) ×6 IMPLANT
PACK VAGINAL MINOR WOMEN LF (CUSTOM PROCEDURE TRAY) ×3 IMPLANT
PAD OB MATERNITY 4.3X12.25 (PERSONAL CARE ITEMS) ×3 IMPLANT
SEAL ROD LENS SCOPE MYOSURE (ABLATOR) ×3 IMPLANT
TOWEL OR 17X24 6PK STRL BLUE (TOWEL DISPOSABLE) ×6 IMPLANT
TUBING AQUILEX INFLOW (TUBING) ×3 IMPLANT
TUBING AQUILEX OUTFLOW (TUBING) ×3 IMPLANT

## 2018-02-01 NOTE — Discharge Instructions (Addendum)
HOME INSTRUCTIONS  Please note any unusual or excessive bleeding, pain, swelling. Mild dizziness or drowsiness are normal for about 24 hours after surgery.   Shower when comfortable  Restrictions: No driving for 24 hours or while taking pain medications.  Activity:  No heavy lifting (> 10 lbs), nothing in vagina (no tampons, douching, or intercourse) x 2 weeks; no tub baths for 2 weeks Vaginal spotting is expected but if your bleeding is heavy, period like,  please call the office   Diet:  You may return to your regular diet.  Do not eat large meals.  Eat small frequent meals throughout the day.  Continue to drink a good amount of water at least 6-8 glasses of water per day, hydration is very important for the healing process.  Pain Management: Take Motrin (ibuprfoen) and/or tylenol as needed for pain.  Always take prescription pain medication with food, it may cause constipation, increase fluids and fiber and you may want to take an over-the-counter stool softener like Colace as needed up to 2x a day.    Alcohol -- Avoid for 24 hours and while taking pain medications.  Nausea: Take sips of ginger ale or soda  Fever -- Call physician if temperature over 101 degrees  Follow up:  If you do not already have a follow up appointment scheduled, please call the office at [phone].  If you experience fever (a temperature greater than 100.4), pain unrelieved by pain medication, shortness of breath, swelling of a single leg, or any other symptoms which are concerning to you please the office immediately.     Post Anesthesia Home Care Instructions  Activity: Get plenty of rest for the remainder of the day. A responsible individual must stay with you for 24 hours following the procedure.  For the next 24 hours, DO NOT: -Drive a car -Advertising copywriterperate machinery -Drink alcoholic beverages -Take any medication unless instructed by your physician -Make any legal decisions or sign important  papers.  Meals: Start with liquid foods such as gelatin or soup. Progress to regular foods as tolerated. Avoid greasy, spicy, heavy foods. If nausea and/or vomiting occur, drink only clear liquids until the nausea and/or vomiting subsides. Call your physician if vomiting continues.  Special Instructions/Symptoms: Your throat may feel dry or sore from the anesthesia or the breathing tube placed in your throat during surgery. If this causes discomfort, gargle with warm salt water. The discomfort should disappear within 24 hours.  If you had a scopolamine patch placed behind your ear for the management of post- operative nausea and/or vomiting:  1. The medication in the patch is effective for 72 hours, after which it should be removed.  Wrap patch in a tissue and discard in the trash. Wash hands thoroughly with soap and water. 2. You may remove the patch earlier than 72 hours if you experience unpleasant side effects which may include dry mouth, dizziness or visual disturbances. 3. Avoid touching the patch. Wash your hands with soap and water after contact with the patch.     NO IBUPROFEN PRODUCTS (MOTRIN, ADVIL) OR ALEVE UNTIL 3:30PM TODAY.

## 2018-02-01 NOTE — Anesthesia Postprocedure Evaluation (Signed)
Anesthesia Post Note  Patient: Kaylee Simmons  Procedure(s) Performed: DILATATION & CURETTAGE, HYSTEROSCOPY (N/A Vagina )     Patient location during evaluation: PACU Anesthesia Type: General Level of consciousness: awake and alert Pain management: pain level controlled Vital Signs Assessment: post-procedure vital signs reviewed and stable Respiratory status: spontaneous breathing, nonlabored ventilation and respiratory function stable Cardiovascular status: blood pressure returned to baseline and stable Postop Assessment: no apparent nausea or vomiting Anesthetic complications: no    Last Vitals:  Vitals:   02/01/18 1030 02/01/18 1045  BP: 106/69 111/74  Pulse: 94 (!) 101  Resp: 19 17  Temp:    SpO2: 100% 100%    Last Pain:  Vitals:   02/01/18 1045  TempSrc:   PainSc: 7    Pain Goal: Patients Stated Pain Goal: 4 (02/01/18 0759)               Malakhi Markwood A.

## 2018-02-01 NOTE — Interval H&P Note (Signed)
History and Physical Interval Note:  02/01/2018 8:41 AM  Kaylee Simmons  has presented today for surgery, with the diagnosis of N93.9 Abnormal uterine bleeding  The various methods of treatment have been discussed with the patient and family. After consideration of risks, benefits and other options for treatment, the patient has consented to  Procedure(s) with comments: DILATATION & CURETTAGE/HYSTEROSCOPY WITH MYOSURE (N/A) - with polypectomy as a surgical intervention .  The patient's history has been reviewed, patient examined, no change in status, stable for surgery.  I have reviewed the patient's chart and labs.  Questions were answered to the patient's satisfaction.     Sharon SellerJennifer M Charistopher Rumble

## 2018-02-01 NOTE — Op Note (Signed)
Operative Report  PreOp: abnormal uterine bleeding PostOp: same Procedure:  Hysteroscopy, Dilation and Curettage Surgeon: Dr. Myna HidalgoJennifer Llewelyn Simmons Anesthesia: General Complications:none EBL: 5ml UOP: 5ml Discrepancy 150cc  Findings:7cm anteverted uterus- proliferative endometrium, no other abnormalities noted.  Ostia visualized  Specimens: endometrial curettings   Procedure: The patient was taken to the operating room where she underwent general anesthesia without difficulty. The patient was placed in a low lithotomy position using Allen stirrups. The patient was examined with the findings as noted above.  She was then prepped and draped in the normal sterile fashion. The bladder was drained using a red rubber urethral catheter. A sterile speculum was inserted into the vagina. A single tooth tenaculum was placed on the anterior lip of the cervix. Cervical block was performed using 1% Lidocaine.  The uterus was then sounded to 7cm. The endocervical canal was then serially dilated to 14 [place] using Hank dilators.  The diagnostic hysteroscope was then inserted without difficulty and noted to have the findings as listed above. Visualization was achieved using NS as a distending medium. The hysteroscope was removed and sharp curettage was performed. The tissue was sent to pathology.  Upon completion, the hysterscop was reinserted-  no uterine perforation was seen. All instrument were then removed. Hemostasis was observed at the cervical site. The patient was repositioned to the supine position. The patient tolerated the procedure without any complications and taken to recovery in stable condition.   Myna HidalgoJennifer Rosemarie Galvis, DO [phone]) [phone] (office)

## 2018-02-01 NOTE — Transfer of Care (Signed)
Immediate Anesthesia Transfer of Care Note  Patient: Kaylee DexterLovetta Syfert  Procedure(s) Performed: DILATATION & CURETTAGE, HYSTEROSCOPY (N/A Vagina )  Patient Location: PACU  Anesthesia Type:General  Level of Consciousness: oriented and sedated  Airway & Oxygen Therapy: Patient Spontanous Breathing and Patient connected to nasal cannula oxygen  Post-op Assessment: Report given to RN  Post vital signs: Reviewed and stable  Last Vitals:  Vitals Value Taken Time  BP    Temp    Pulse    Resp    SpO2      Last Pain:  Vitals:   02/01/18 0759  TempSrc: Oral      Patients Stated Pain Goal: 4 (02/01/18 0759)  Complications: No apparent anesthesia complications

## 2018-02-01 NOTE — Anesthesia Preprocedure Evaluation (Signed)
Anesthesia Evaluation  Patient identified by MRN, date of birth, ID band Patient awake    Reviewed: Allergy & Precautions, NPO status , Patient's Chart, lab work & pertinent test results  Airway Mallampati: II  TM Distance: >3 FB Neck ROM: Full    Dental no notable dental hx. (+) Teeth Intact   Pulmonary neg pulmonary ROS,    Pulmonary exam normal breath sounds clear to auscultation       Cardiovascular negative cardio ROS Normal cardiovascular exam Rhythm:Regular Rate:Normal     Neuro/Psych negative psych ROS   GI/Hepatic negative GI ROS, Neg liver ROS,   Endo/Other  negative endocrine ROS  Renal/GU negative Renal ROS  negative genitourinary   Musculoskeletal   Abdominal   Peds  Hematology  (+) anemia ,   Anesthesia Other Findings   Reproductive/Obstetrics AUB Uterine polyp                             Anesthesia Physical Anesthesia Plan  ASA: II  Anesthesia Plan: General   Post-op Pain Management:    Induction: Intravenous  PONV Risk Score and Plan: 4 or greater and Scopolamine patch - Pre-op, Midazolam, Dexamethasone, Ondansetron and Treatment may vary due to age or medical condition  Airway Management Planned: LMA  Additional Equipment:   Intra-op Plan:   Post-operative Plan: Extubation in OR  Informed Consent: I have reviewed the patients History and Physical, chart, labs and discussed the procedure including the risks, benefits and alternatives for the proposed anesthesia with the patient or authorized representative who has indicated his/her understanding and acceptance.   Dental advisory given  Plan Discussed with: CRNA, Anesthesiologist and Surgeon  Anesthesia Plan Comments:         Anesthesia Quick Evaluation

## 2018-02-02 ENCOUNTER — Encounter (HOSPITAL_COMMUNITY): Payer: Self-pay | Admitting: Obstetrics & Gynecology

## 2018-04-24 IMAGING — US US TRANSVAGINAL NON-OB
1 series · 14 of 25 positions shown · non-contrast
Comparison: None

CLINICAL DATA: Dysfunctional uterine bleeding for 12 days with
pelvic pain.

EXAM:
TRANSABDOMINAL AND TRANSVAGINAL ULTRASOUND OF PELVIS
TECHNIQUE: Both transabdominal and transvaginal ultrasound examinations of the
pelvis were performed. Transabdominal technique was performed for
global imaging of the pelvis including uterus, ovaries, adnexal
regions, and pelvic cul-de-sac. It was necessary to proceed with
endovaginal exam following the transabdominal exam to visualize the
endometrial complex and adnexal structures to an adequate degree.

[Series 1: us transvaginal non-ob · 0.18mm/px · 14 of 46 slices shown]
[im 1/46]
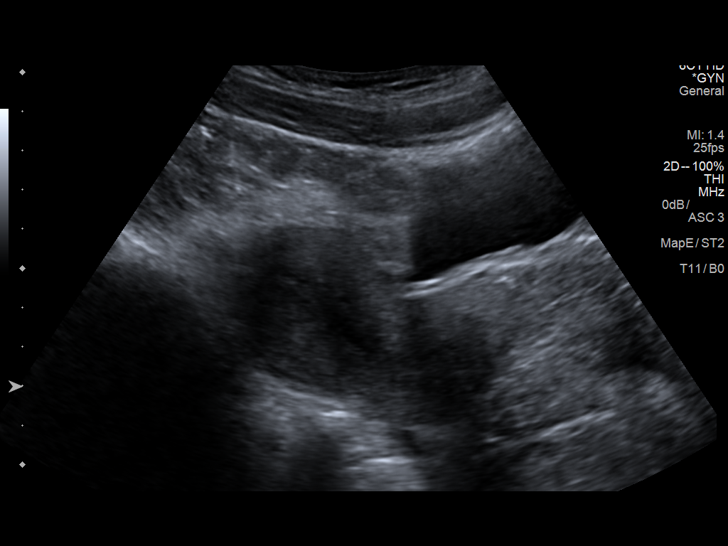
[im 4/46]
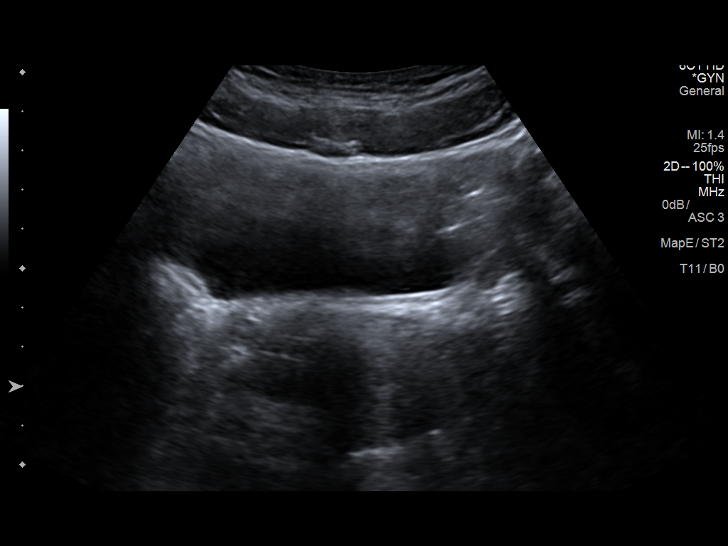
[im 8/46]
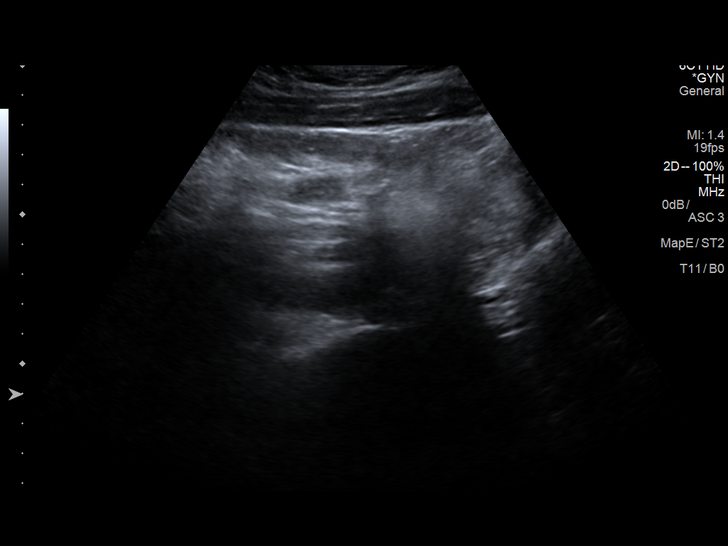
[im 12/46]
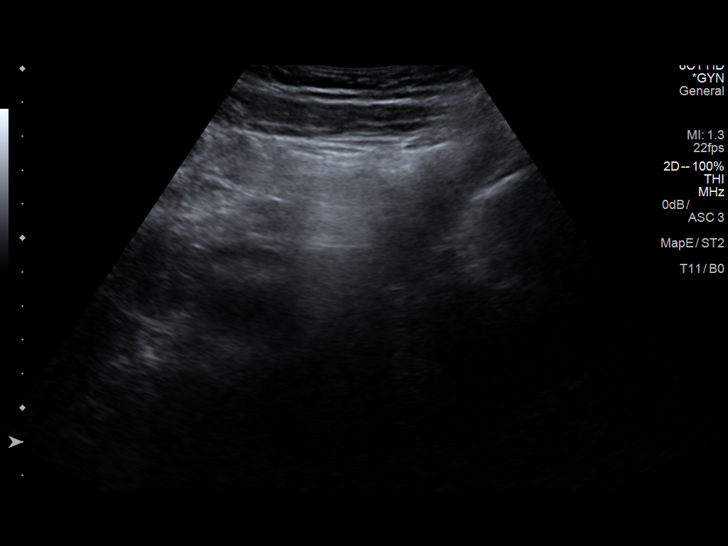
[im 16/46]
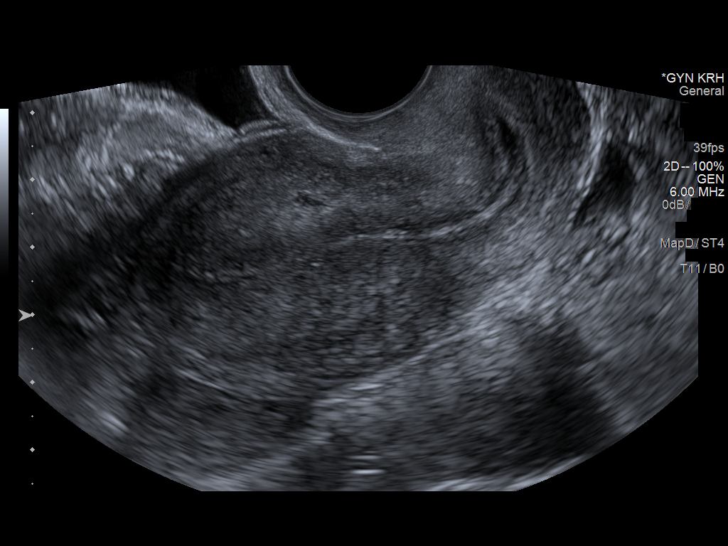
[im 17/46]
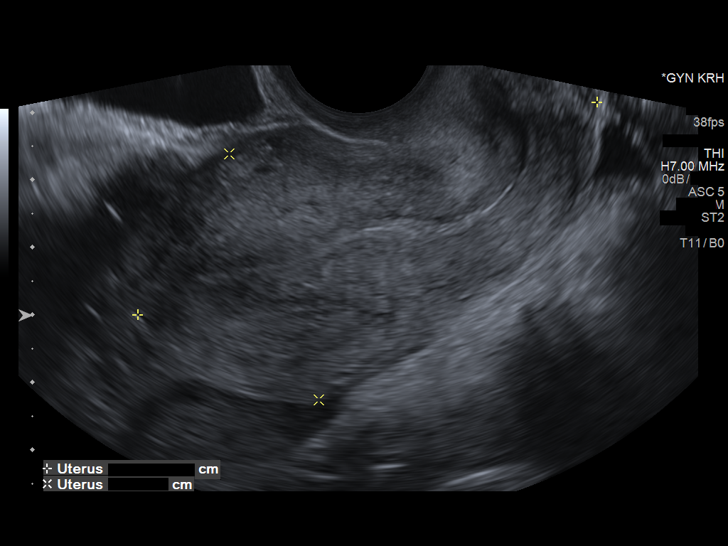
[im 21/46]
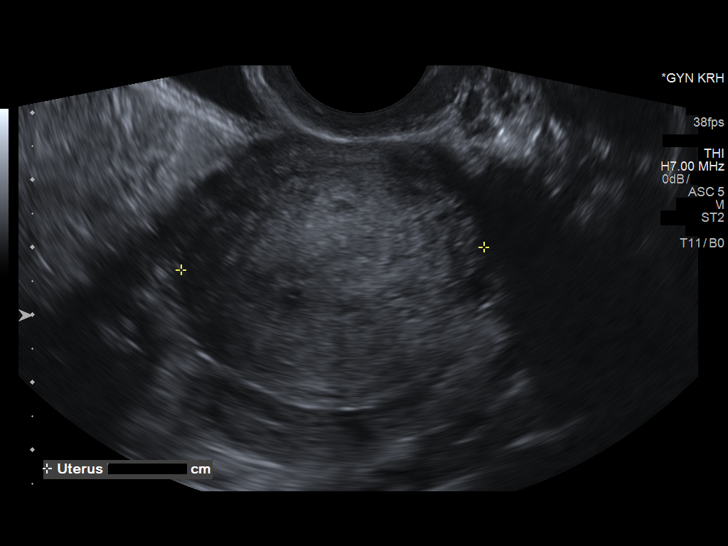
[im 25/46]
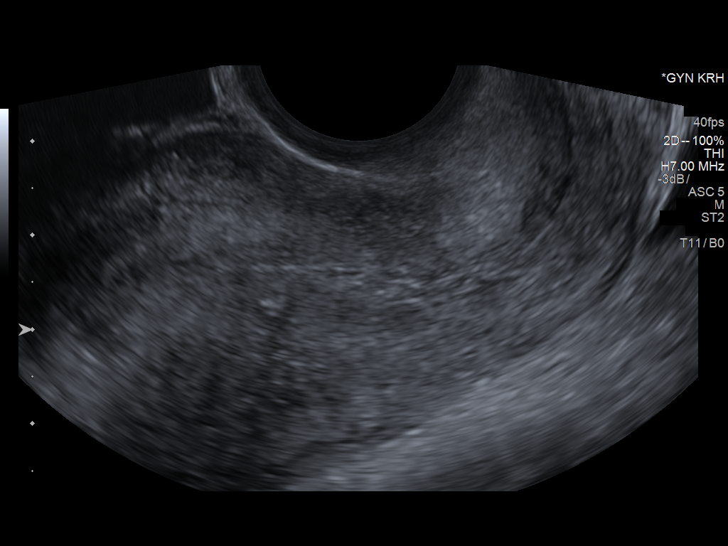
[im 29/46]
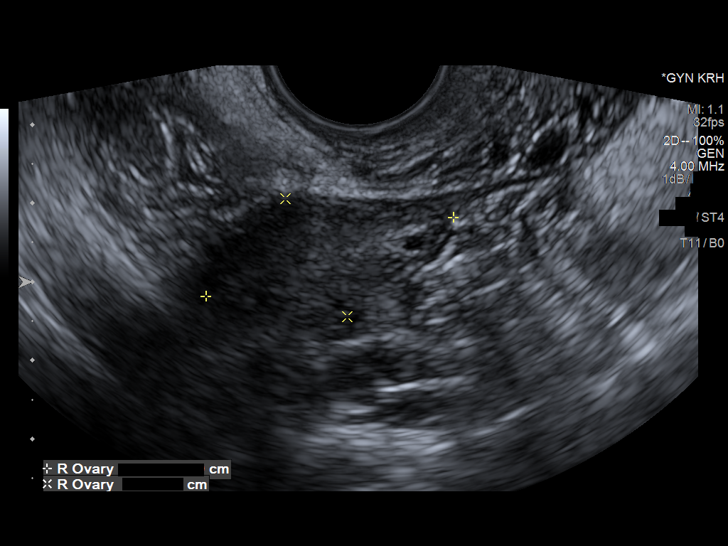
[im 31/46]
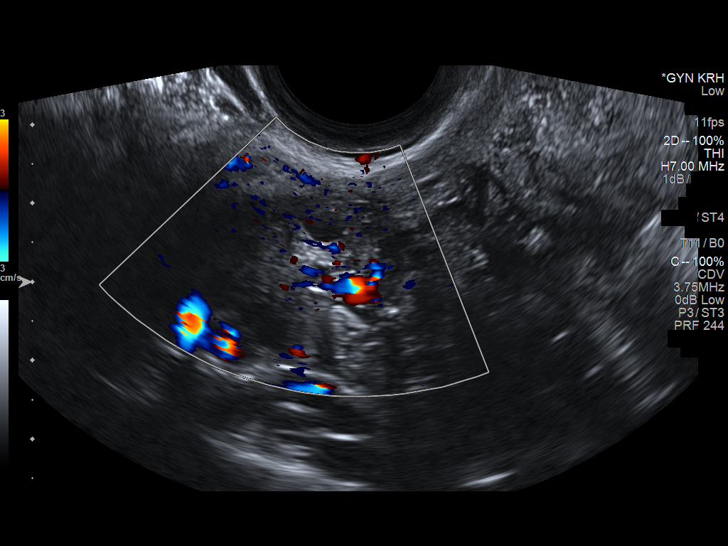
[im 34/46]
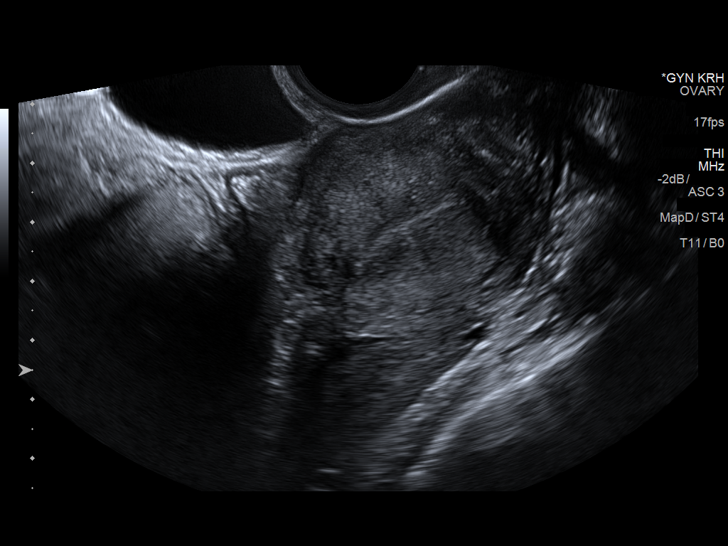
[im 38/46]
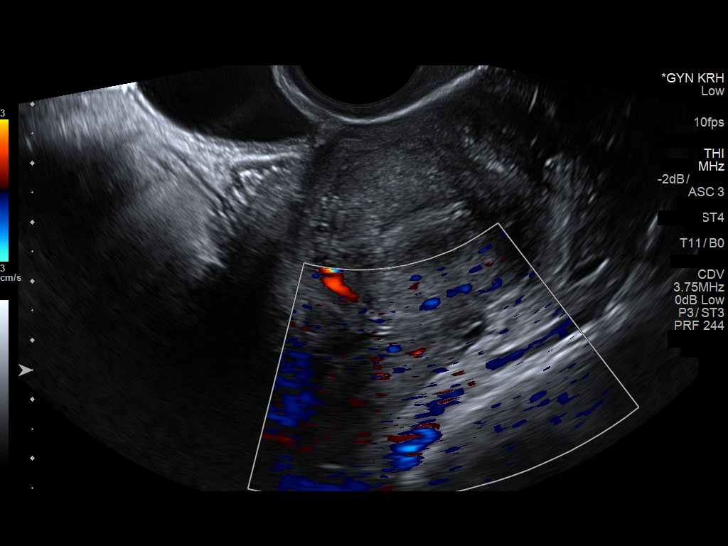
[im 42/46]
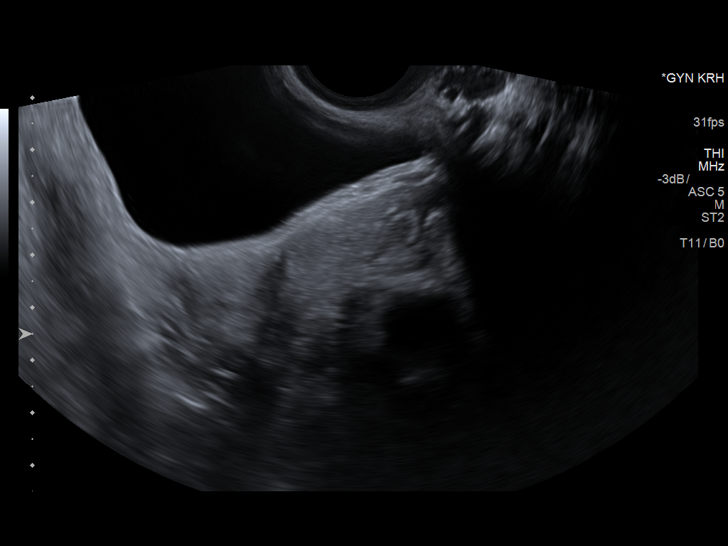
[im 46/46]
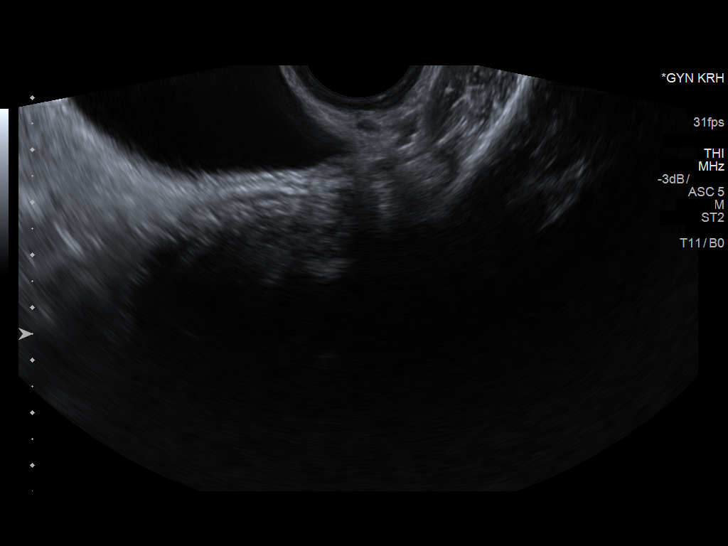

[14 of 25 positions shown; findings below may reference images not displayed]

FINDINGS: Uterus

Measurements: 7.5 x 3.9 x 4.5 cm. No fibroids or other mass
visualized.

Endometrium

Thickness: 3 mm. No focal abnormality visualized. No mass or fluid
within the endometrial canal. Trace simple appearing fluid within
the endocervical canal.

Right ovary

Measurements: 3.3 x 1.7 x 2.4 cm. Normal appearance/no adnexal mass.

Left ovary

Measurements: 3.7 x 1.8 x 2.2 cm. Normal appearance/no adnexal mass.

Other findings

No abnormal free fluid.
IMPRESSION: Normal pelvic ultrasound.
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
## 2022-11-07 ENCOUNTER — Encounter (HOSPITAL_COMMUNITY): Payer: Self-pay

## 2022-11-07 ENCOUNTER — Ambulatory Visit (HOSPITAL_COMMUNITY)
Admission: EM | Admit: 2022-11-07 | Discharge: 2022-11-07 | Discharge status: Against Medical Advice | Payer: BC Managed Care – PPO | Attending: Nurse Practitioner | Admitting: Nurse Practitioner

## 2022-11-07 NOTE — ED Notes (Signed)
Pt left before seen by provider. Covid swab to not be ran per Doreatha Martin, NP

## 2022-11-07 NOTE — ED Triage Notes (Signed)
Pt is here for fever, cough, chest congestion, runny nose, nasal congestion, headache , sore throat,  x 5 days  pt had  body aches , chills but, not any more . Pt does not have her tatse or smell.
